# Patient Record
Sex: Female | Born: 1990 | Race: Black or African American | Hispanic: No | Marital: Single | Smoking: Current every day smoker
Health system: Southern US, Community
[De-identification: ages and names within clinical notes are randomized; demographics above are authoritative.]

---

## 2014-03-31 ENCOUNTER — Emergency Department (HOSPITAL_COMMUNITY): Payer: BC Managed Care – PPO

## 2014-03-31 ENCOUNTER — Emergency Department (HOSPITAL_COMMUNITY)
Admission: EM | Admit: 2014-03-31 | Discharge: 2014-04-01 | Disposition: A | Discharge status: Home / Self Care | Payer: BC Managed Care – PPO | Attending: Emergency Medicine | Admitting: Emergency Medicine

## 2014-03-31 ENCOUNTER — Encounter (HOSPITAL_COMMUNITY): Payer: Self-pay | Admitting: Emergency Medicine

## 2014-03-31 DIAGNOSIS — R079 Chest pain, unspecified: Secondary | ICD-10-CM | POA: Insufficient documentation

## 2014-03-31 DIAGNOSIS — R519 Headache, unspecified: Secondary | ICD-10-CM

## 2014-03-31 DIAGNOSIS — R51 Headache: Secondary | ICD-10-CM | POA: Insufficient documentation

## 2014-03-31 DIAGNOSIS — F17200 Nicotine dependence, unspecified, uncomplicated: Secondary | ICD-10-CM | POA: Insufficient documentation

## 2014-03-31 DIAGNOSIS — R55 Syncope and collapse: Secondary | ICD-10-CM | POA: Insufficient documentation

## 2014-03-31 DIAGNOSIS — R110 Nausea: Secondary | ICD-10-CM | POA: Insufficient documentation

## 2014-03-31 LAB — CBC WITH DIFFERENTIAL/PLATELET
BASOS ABS: 0.1 10*3/uL (ref 0.0–0.1)
Basophils Relative: 1 % (ref 0–1)
EOS PCT: 2 % (ref 0–5)
Eosinophils Absolute: 0.2 10*3/uL (ref 0.0–0.7)
HEMATOCRIT: 40.2 % (ref 36.0–46.0)
HEMOGLOBIN: 13.4 g/dL (ref 12.0–15.0)
LYMPHS ABS: 4.1 10*3/uL — AB (ref 0.7–4.0)
LYMPHS PCT: 34 % (ref 12–46)
MCH: 31.4 pg (ref 26.0–34.0)
MCHC: 33.3 g/dL (ref 30.0–36.0)
MCV: 94.1 fL (ref 78.0–100.0)
MONO ABS: 0.7 10*3/uL (ref 0.1–1.0)
MONOS PCT: 6 % (ref 3–12)
NEUTROS ABS: 6.8 10*3/uL (ref 1.7–7.7)
Neutrophils Relative %: 57 % (ref 43–77)
Platelets: 468 10*3/uL — ABNORMAL HIGH (ref 150–400)
RBC: 4.27 MIL/uL (ref 3.87–5.11)
RDW: 13.8 % (ref 11.5–15.5)
WBC: 11.9 10*3/uL — AB (ref 4.0–10.5)

## 2014-03-31 LAB — BASIC METABOLIC PANEL
BUN: 16 mg/dL (ref 6–23)
CHLORIDE: 102 meq/L (ref 96–112)
CO2: 24 meq/L (ref 19–32)
CREATININE: 0.98 mg/dL (ref 0.50–1.10)
Calcium: 9.3 mg/dL (ref 8.4–10.5)
GFR calc Af Amer: >90 mL/min (ref >90)
GFR calc non Af Amer: 81 mL/min — ABNORMAL LOW (ref >90)
GLUCOSE: 76 mg/dL (ref 70–99)
Potassium: 4.3 mEq/L (ref 3.7–5.3)
Sodium: 138 mEq/L (ref 137–147)

## 2014-03-31 LAB — HCG, SERUM, QUALITATIVE: Preg, Serum: NEGATIVE

## 2014-03-31 NOTE — Discharge Instructions (Signed)
Nitroglycerin labs head CT, EKG, and chest x-ray, are all normal please try to stay hydrated if you again developed symptoms.  Please come in immediately for evaluation, while you're having symptoms for observation and further diagnostic evaluations

## 2014-03-31 NOTE — ED Notes (Signed)
ED NP at bedside

## 2014-03-31 NOTE — ED Notes (Signed)
Pt c/o CP and left arm numbness last night that resolved in 25 mins, sts she was cooking in the kitchen prior to it started and felt very dehydrated then sat down and had the cp. Denies any of these symptoms now. Pt sts her mom told her she should come in a get checked out because she was concerned about the symptoms. Nad, skin warm and dry, resp e/u.

## 2014-03-31 NOTE — ED Provider Notes (Signed)
CSN: [id number]     Arrival date & time 03/31/14  1653 History   First MD Initiated Contact with Patient 03/31/14 2049     Chief Complaint  Patient presents with  . Numbness  . Chest Pain     (Consider location/radiation/quality/duration/timing/severity/associated sxs/prior Treatment) HPI Comments: Patient states, that last night, while she was cooking.  She felt like she became dehydrated and dizzy.  He sat down on the floor after which she developed a headache, and left arm numbness.  That resolved after approximately 15 minutes.  She was evaluated by EMS, and not transported as her symptoms had resolved.  Today, she just hasn't felt back to normal, but denies any new acute onset of the symptoms, but her mother insisted that she come to the emergency department for further evaluation, stating, that she thinks her daughter had a stroke. Patient has restarted taking her birth control pills.  She had a 3 month hiatus.  Her only reaction to birth control pills, is nausea  The history is provided by the patient.    History reviewed. No pertinent past medical history. History reviewed. No pertinent past surgical history. No family history on file. History  Substance Use Topics  . Smoking status: Current Every Day Smoker    Types: Cigars  . Smokeless tobacco: Not on file  . Alcohol Use: No   OB History   Grav Para Term Preterm Abortions TAB SAB Ect Mult Living                 Review of Systems  Constitutional: Negative for fever and chills.  Eyes: Negative for photophobia and visual disturbance.  Respiratory: Negative for shortness of breath.   Cardiovascular: Positive for chest pain. Negative for palpitations and leg swelling.  Gastrointestinal: Positive for nausea. Negative for vomiting.  Skin: Negative for rash and wound.  Neurological: Positive for dizziness, weakness and numbness.  All other systems reviewed and are negative.     Allergies  Review of patient's allergies  indicates no known allergies.  Home Medications   Prior to Admission medications   Medication Sig Start Date End Date Taking? Authorizing Provider  PRESCRIPTION MEDICATION Take 1 tablet by mouth daily. Birth control   Yes Historical Provider, MD   BP 140/82  Pulse 86  Temp(Src) 98.8 F (37.1 C) (Oral)  Resp 20  SpO2 100%  LMP 03/19/2014 Physical Exam  Nursing note and vitals reviewed. Constitutional: She is oriented to person, place, and time. She appears well-developed and well-nourished.  HENT:  Head: Normocephalic.  Right Ear: External ear normal.  Left Ear: External ear normal.  Mouth/Throat: Oropharynx is clear and moist.  Neck: Normal range of motion. No spinous process tenderness and no muscular tenderness present.  Cardiovascular: Normal rate.   Pulmonary/Chest: Effort normal.  Abdominal: Soft.  Musculoskeletal: Normal range of motion.  Neurological: She is alert and oriented to person, place, and time. No cranial nerve deficit. Coordination normal.  Equal strength throughout  Skin: Skin is warm.    ED Course  Procedures (including critical care time) Labs Review Labs Reviewed  CBC WITH DIFFERENTIAL - Abnormal; Notable for the following:    WBC 11.9 (*)    Platelets 468 (*)    Lymphs Abs 4.1 (*)    All other components within normal limits  BASIC METABOLIC PANEL - Abnormal; Notable for the following:    GFR calc non Af Amer 81 (*)    All other components within normal limits  HCG, SERUM,  QUALITATIVE    Imaging Review Dg Chest 2 View  03/31/2014   CLINICAL DATA:  Chest pain  EXAM: CHEST  2 VIEW  COMPARISON:  None.  FINDINGS: The lungs are clear. Heart size and pulmonary vascularity are normal. No pneumothorax. No adenopathy. No bone lesions.  IMPRESSION: No abnormality noted.   Electronically Signed   By: Bretta BangWilliam  Woodruff M.D.   On: 03/31/2014 19:47   Ct Head Wo Contrast  03/31/2014   CLINICAL DATA:  Chest pain  EXAM: CT HEAD WITHOUT CONTRAST  TECHNIQUE:  Contiguous axial images were obtained from the base of the skull through the vertex without intravenous contrast.  COMPARISON:  None.  FINDINGS: The ventricles are normal in size and position. There is no intracranial hemorrhage nor intracranial mass effect. There is no evidence of an evolving ischemic event. There are no abnormal intracranial calcifications. The cerebellum and brainstem are normal.  The calvarium is intact. The observed portions of the paranasal sinuses and mastoid air cells are clear. A metallic earring is present on the left.  IMPRESSION: There is no acute intracranial abnormality.   Electronically Signed   By: David  SwazilandJordan   On: 03/31/2014 23:50     EKG Interpretation   Date/Time:  Friday March 31 2014 20:42:15 EDT Ventricular Rate:  73 PR Interval:  158 QRS Duration: 81 QT Interval:  394 QTC Calculation: 434 R Axis:   57 Text Interpretation:  Sinus rhythm Confirmed by Rhunette CroftNANAVATI, MD, Janey GentaANKIT  [id number] on 03/31/2014 8:51:35 PM      MDM  Patient's labs, chest x-ray, EKG, are all within normal limits.  Will obtain head CT Final diagnoses:  Headache  Near syncope         Arman FilterGail K Mileigh Tilley, NP 03/31/14 2357

## 2014-04-01 NOTE — ED Provider Notes (Signed)
Medical screening examination/treatment/procedure(s) were performed by non-physician practitioner and as supervising physician I was immediately available for consultation/collaboration.   EKG Interpretation   Date/Time:  Friday March 31 2014 20:42:15 EDT Ventricular Rate:  73 PR Interval:  158 QRS Duration: 81 QT Interval:  394 QTC Calculation: 434 R Axis:   57 Text Interpretation:  Sinus rhythm Confirmed by Rhunette Croft, MD, Janelli Welling  ([id number]) on 03/31/2014 8:51:35 PM       Derwood Kaplan, MD 04/01/14 1018

## 2014-04-01 NOTE — ED Notes (Signed)
Pt given resource packet

## 2015-07-09 IMAGING — CT CT HEAD W/O CM
1 of 2 series · 16 of 30 positions shown, 20 images · non-contrast
Comparison: None.

CLINICAL DATA: Chest pain

EXAM:
CT HEAD WITHOUT CONTRAST
TECHNIQUE: Contiguous axial images were obtained from the base of the skull
through the vertex without intravenous contrast.

[Series 3: head 2.0 h70h · axial · 0.39mm/px · z∈[-161,-43]mm · 16 of 67 slices shown, 20 images]
[im 4/67  brain]
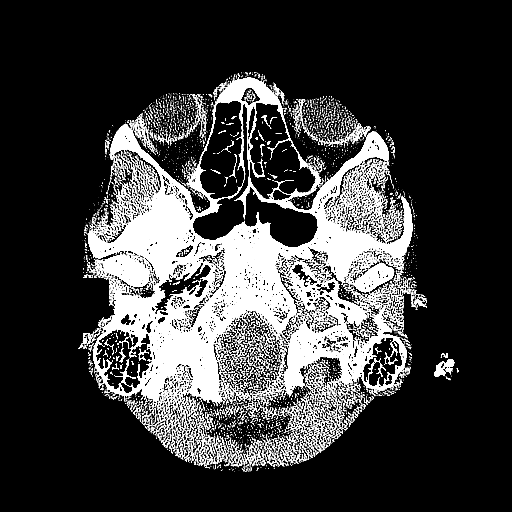
[im 4/67  bone]
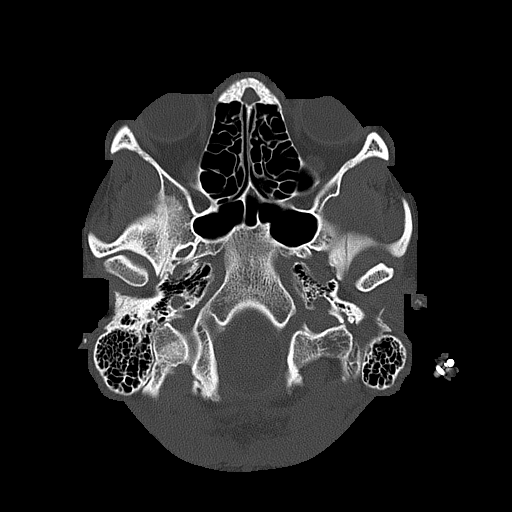
[im 7/67  brain]
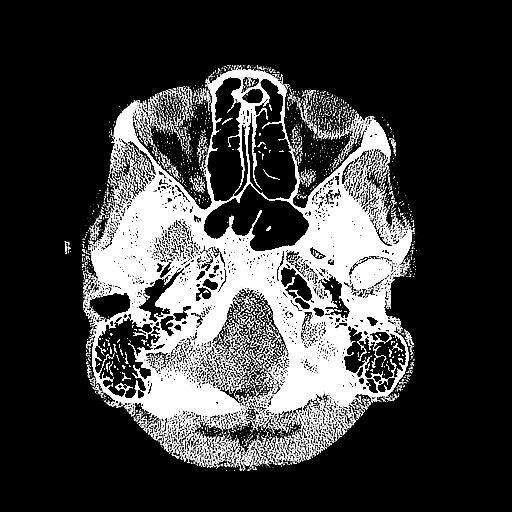
[im 10/67  brain]
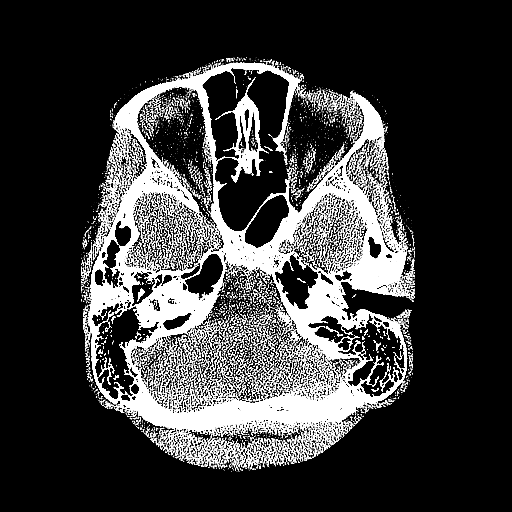
[im 17/67  brain]
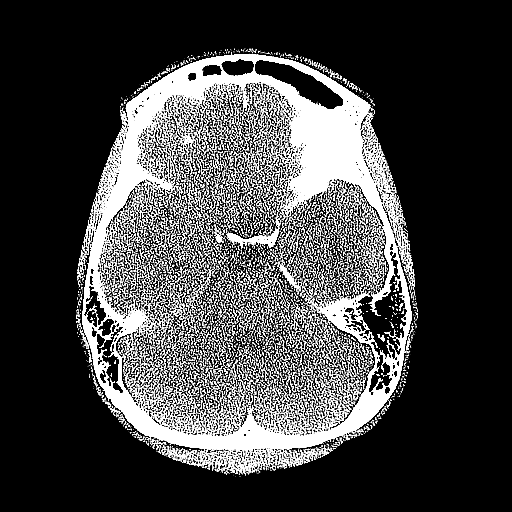
[im 20/67  brain]
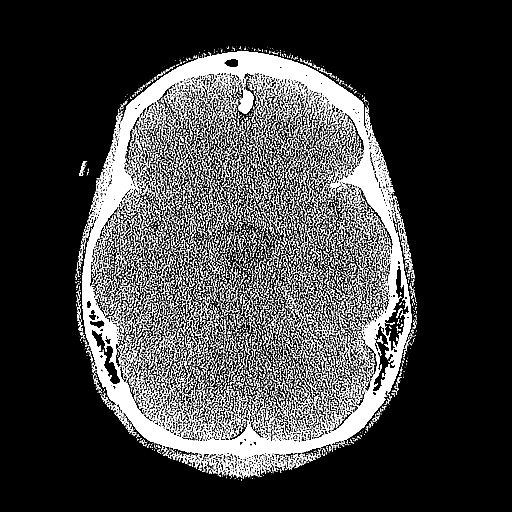
[im 20/67  bone]
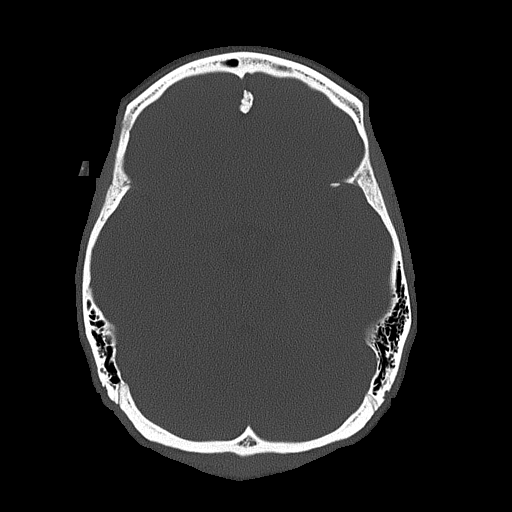
[im 24/67  brain]
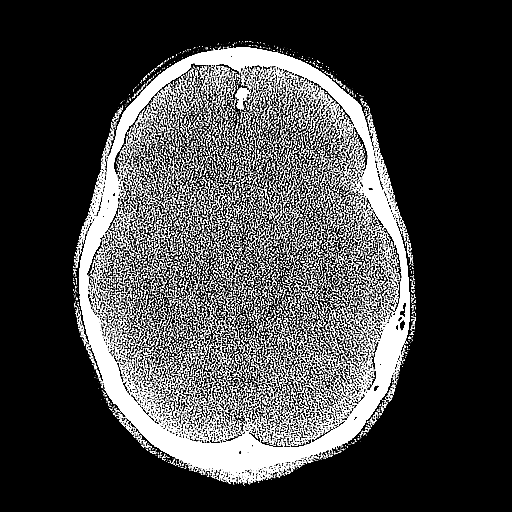
[im 27/67  brain]
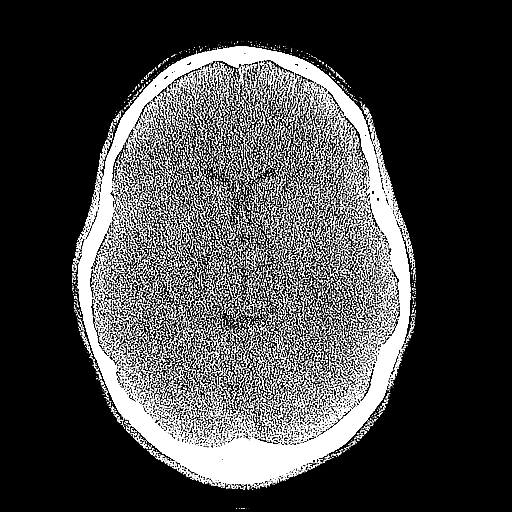
[im 30/67  brain]
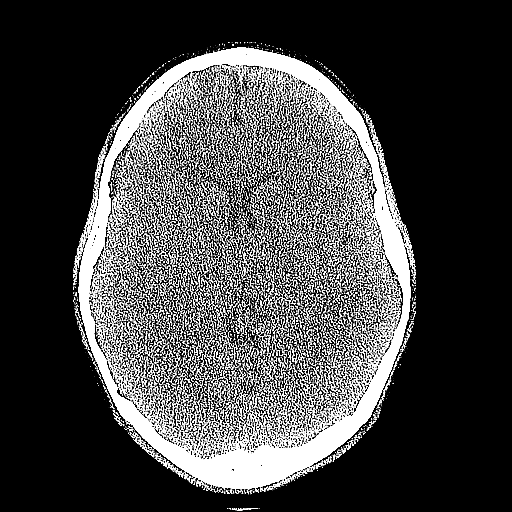
[im 37/67  brain]
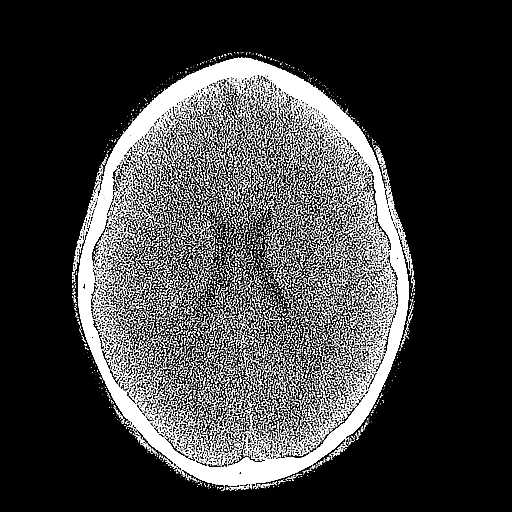
[im 37/67  bone]
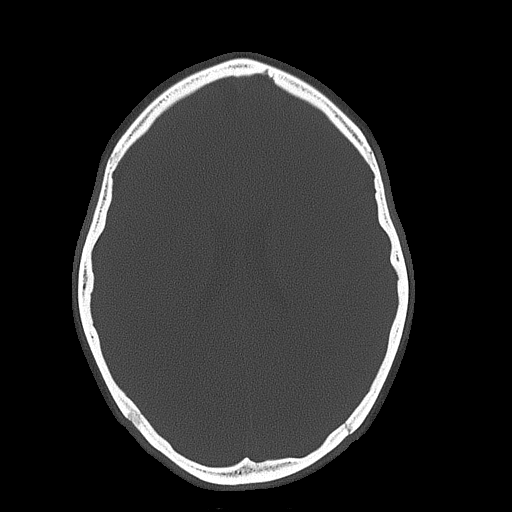
[im 40/67  brain]
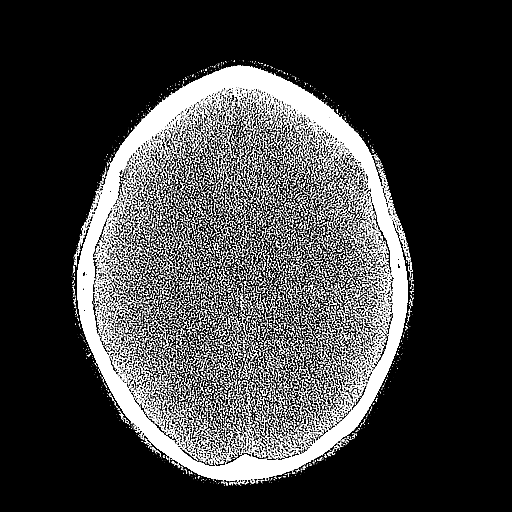
[im 43/67  brain]
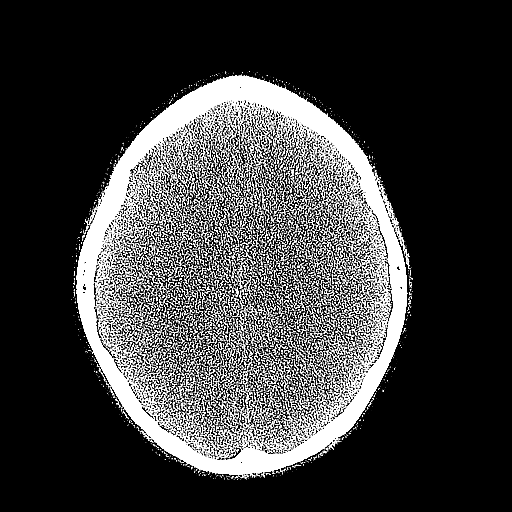
[im 47/67  brain]
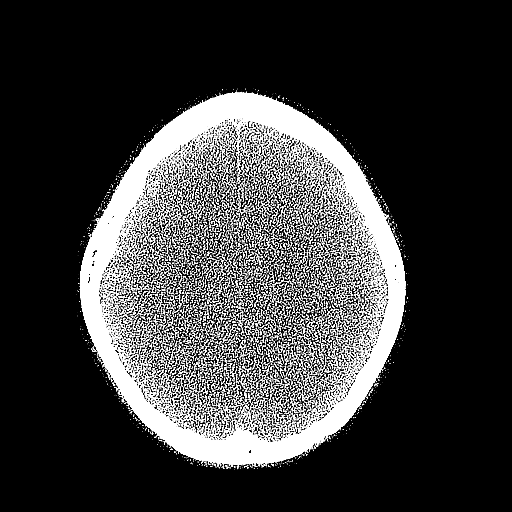
[im 50/67  brain]
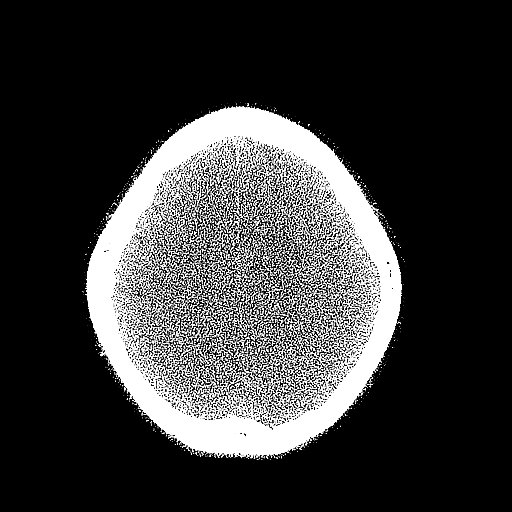
[im 50/67  bone]
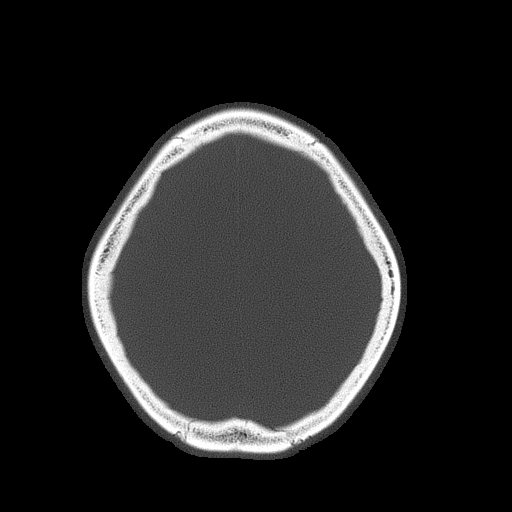
[im 57/67  brain]
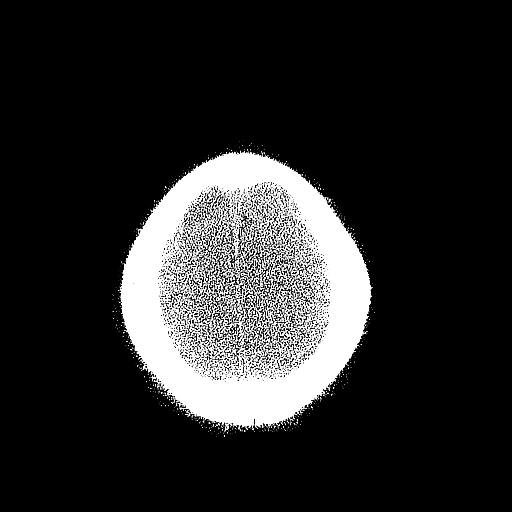
[im 60/67  brain]
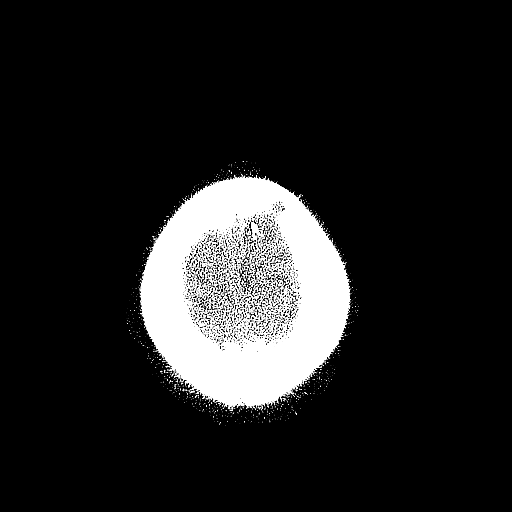
[im 63/67  brain]
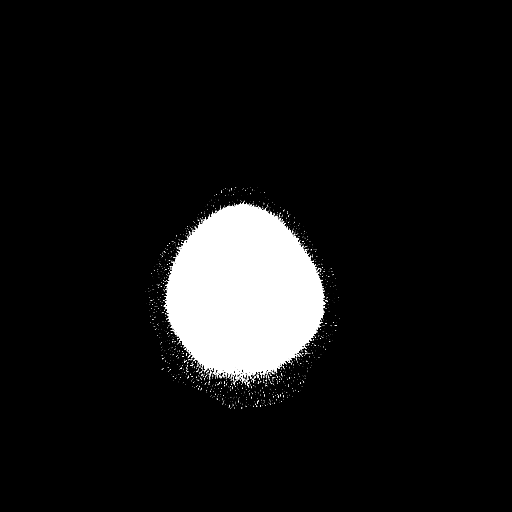

[16 of 30 positions shown; findings below may reference images not displayed]

FINDINGS: The ventricles are normal in size and position. There is no
intracranial hemorrhage nor intracranial mass effect. There is no
evidence of an evolving ischemic event. There are no abnormal
intracranial calcifications. The cerebellum and brainstem are
normal.

The calvarium is intact. The observed portions of the paranasal
sinuses and mastoid air cells are clear. A metallic earring is
present on the left.
IMPRESSION: There is no acute intracranial abnormality.
